# Patient Record
Sex: Female | Born: 1995 | Race: White | Hispanic: No
Health system: Southern US, Community
[De-identification: ages and names within clinical notes are randomized; demographics above are authoritative.]

---

## 2019-04-04 ENCOUNTER — Inpatient Hospital Stay (HOSPITAL_COMMUNITY)

## 2019-04-04 ENCOUNTER — Encounter (HOSPITAL_COMMUNITY): Payer: Self-pay | Admitting: *Deleted

## 2019-04-04 ENCOUNTER — Emergency Department (HOSPITAL_COMMUNITY)

## 2019-04-04 ENCOUNTER — Other Ambulatory Visit: Payer: Self-pay

## 2019-04-04 ENCOUNTER — Inpatient Hospital Stay (HOSPITAL_COMMUNITY)
Admission: EM | Admit: 2019-04-04 | Discharge: 2019-04-28 | DRG: 922 | Disposition: E | Attending: Internal Medicine | Admitting: Internal Medicine

## 2019-04-04 DIAGNOSIS — R778 Other specified abnormalities of plasma proteins: Secondary | ICD-10-CM | POA: Diagnosis not present

## 2019-04-04 DIAGNOSIS — J9602 Acute respiratory failure with hypercapnia: Secondary | ICD-10-CM | POA: Diagnosis present

## 2019-04-04 DIAGNOSIS — R402112 Coma scale, eyes open, never, at arrival to emergency department: Secondary | ICD-10-CM | POA: Diagnosis present

## 2019-04-04 DIAGNOSIS — O99321 Drug use complicating pregnancy, first trimester: Secondary | ICD-10-CM | POA: Diagnosis present

## 2019-04-04 DIAGNOSIS — X838XXA Intentional self-harm by other specified means, initial encounter: Secondary | ICD-10-CM

## 2019-04-04 DIAGNOSIS — Z20822 Contact with and (suspected) exposure to covid-19: Secondary | ICD-10-CM | POA: Diagnosis present

## 2019-04-04 DIAGNOSIS — R7401 Elevation of levels of liver transaminase levels: Secondary | ICD-10-CM | POA: Diagnosis not present

## 2019-04-04 DIAGNOSIS — K921 Melena: Secondary | ICD-10-CM | POA: Diagnosis present

## 2019-04-04 DIAGNOSIS — T71162A Asphyxiation due to hanging, intentional self-harm, initial encounter: Secondary | ICD-10-CM | POA: Diagnosis present

## 2019-04-04 DIAGNOSIS — Z66 Do not resuscitate: Secondary | ICD-10-CM | POA: Diagnosis present

## 2019-04-04 DIAGNOSIS — G936 Cerebral edema: Secondary | ICD-10-CM | POA: Diagnosis present

## 2019-04-04 DIAGNOSIS — I469 Cardiac arrest, cause unspecified: Secondary | ICD-10-CM | POA: Diagnosis present

## 2019-04-04 DIAGNOSIS — Z3A11 11 weeks gestation of pregnancy: Secondary | ICD-10-CM

## 2019-04-04 DIAGNOSIS — E872 Acidosis: Secondary | ICD-10-CM | POA: Diagnosis present

## 2019-04-04 DIAGNOSIS — K7201 Acute and subacute hepatic failure with coma: Secondary | ICD-10-CM | POA: Diagnosis present

## 2019-04-04 DIAGNOSIS — O99351 Diseases of the nervous system complicating pregnancy, first trimester: Secondary | ICD-10-CM | POA: Diagnosis present

## 2019-04-04 DIAGNOSIS — F1120 Opioid dependence, uncomplicated: Secondary | ICD-10-CM | POA: Diagnosis present

## 2019-04-04 DIAGNOSIS — O26611 Liver and biliary tract disorders in pregnancy, first trimester: Secondary | ICD-10-CM | POA: Diagnosis present

## 2019-04-04 DIAGNOSIS — N179 Acute kidney failure, unspecified: Secondary | ICD-10-CM | POA: Diagnosis present

## 2019-04-04 DIAGNOSIS — E876 Hypokalemia: Secondary | ICD-10-CM | POA: Diagnosis present

## 2019-04-04 DIAGNOSIS — O99411 Diseases of the circulatory system complicating pregnancy, first trimester: Secondary | ICD-10-CM | POA: Diagnosis present

## 2019-04-04 DIAGNOSIS — R402212 Coma scale, best verbal response, none, at arrival to emergency department: Secondary | ICD-10-CM | POA: Diagnosis present

## 2019-04-04 DIAGNOSIS — R402312 Coma scale, best motor response, none, at arrival to emergency department: Secondary | ICD-10-CM | POA: Diagnosis present

## 2019-04-04 DIAGNOSIS — Z0189 Encounter for other specified special examinations: Secondary | ICD-10-CM

## 2019-04-04 DIAGNOSIS — O99281 Endocrine, nutritional and metabolic diseases complicating pregnancy, first trimester: Secondary | ICD-10-CM | POA: Diagnosis present

## 2019-04-04 DIAGNOSIS — J9601 Acute respiratory failure with hypoxia: Secondary | ICD-10-CM | POA: Diagnosis present

## 2019-04-04 DIAGNOSIS — T71164A Asphyxiation due to hanging, undetermined, initial encounter: Secondary | ICD-10-CM | POA: Diagnosis present

## 2019-04-04 DIAGNOSIS — G931 Anoxic brain damage, not elsewhere classified: Secondary | ICD-10-CM | POA: Diagnosis present

## 2019-04-04 DIAGNOSIS — Z3490 Encounter for supervision of normal pregnancy, unspecified, unspecified trimester: Secondary | ICD-10-CM

## 2019-04-04 DIAGNOSIS — Y92143 Cell of prison as the place of occurrence of the external cause: Secondary | ICD-10-CM | POA: Diagnosis not present

## 2019-04-04 LAB — COMPREHENSIVE METABOLIC PANEL
ALT: 351 U/L — ABNORMAL HIGH (ref 0–44)
ALT: 622 U/L — ABNORMAL HIGH (ref 0–44)
AST: 315 U/L — ABNORMAL HIGH (ref 15–41)
AST: 562 U/L — ABNORMAL HIGH (ref 15–41)
Albumin: 4.2 g/dL (ref 3.5–5.0)
Albumin: 3.4 g/dL — ABNORMAL LOW (ref 3.5–5.0)
Alkaline Phosphatase: 107 U/L (ref 38–126)
Alkaline Phosphatase: 163 U/L — ABNORMAL HIGH (ref 38–126)
Anion gap: 25 — ABNORMAL HIGH (ref 5–15)
BUN: 16 mg/dL (ref 6–20)
BUN: 25 mg/dL — ABNORMAL HIGH (ref 6–20)
CO2: 12 mmol/L — ABNORMAL LOW (ref 22–32)
CO2: 9 mmol/L — ABNORMAL LOW (ref 22–32)
Calcium: 9 mg/dL (ref 8.9–10.3)
Calcium: 7.6 mg/dL — ABNORMAL LOW (ref 8.9–10.3)
Chloride: 97 mmol/L — ABNORMAL LOW (ref 98–111)
Chloride: 104 mmol/L (ref 98–111)
Creatinine, Ser: 1.57 mg/dL — ABNORMAL HIGH (ref 0.44–1.00)
Creatinine, Ser: 1.6 mg/dL — ABNORMAL HIGH (ref 0.44–1.00)
GFR calc Af Amer: 53 mL/min — ABNORMAL LOW (ref >60)
GFR calc Af Amer: 52 mL/min — ABNORMAL LOW (ref >60)
GFR calc non Af Amer: 46 mL/min — ABNORMAL LOW (ref >60)
GFR calc non Af Amer: 45 mL/min — ABNORMAL LOW (ref >60)
Glucose, Bld: 232 mg/dL — ABNORMAL HIGH (ref 70–99)
Glucose, Bld: 289 mg/dL — ABNORMAL HIGH (ref 70–99)
Potassium: 3.1 mmol/L — ABNORMAL LOW (ref 3.5–5.1)
Potassium: 3.5 mmol/L (ref 3.5–5.1)
Sodium: 137 mmol/L (ref 135–145)
Sodium: 138 mmol/L (ref 135–145)
Total Bilirubin: 0.9 mg/dL (ref 0.3–1.2)
Total Bilirubin: 1.5 mg/dL — ABNORMAL HIGH (ref 0.3–1.2)
Total Protein: 7.3 g/dL (ref 6.5–8.1)
Total Protein: 5.5 g/dL — ABNORMAL LOW (ref 6.5–8.1)

## 2019-04-04 LAB — CBC WITH DIFFERENTIAL/PLATELET
Abs Immature Granulocytes: 1.23 10*3/uL — ABNORMAL HIGH (ref 0.00–0.07)
Basophils Absolute: 0.1 10*3/uL (ref 0.0–0.1)
Basophils Relative: 1 %
Eosinophils Absolute: 0.1 10*3/uL (ref 0.0–0.5)
Eosinophils Relative: 1 %
HCT: 44.6 % (ref 36.0–46.0)
Hemoglobin: 13.9 g/dL (ref 12.0–15.0)
Immature Granulocytes: 7 %
Lymphocytes Relative: 27 %
Lymphs Abs: 4.8 10*3/uL — ABNORMAL HIGH (ref 0.7–4.0)
MCH: 30.3 pg (ref 26.0–34.0)
MCHC: 31.2 g/dL (ref 30.0–36.0)
MCV: 97.2 fL (ref 80.0–100.0)
Monocytes Absolute: 0.6 10*3/uL (ref 0.1–1.0)
Monocytes Relative: 4 %
Neutro Abs: 10.6 10*3/uL — ABNORMAL HIGH (ref 1.7–7.7)
Neutrophils Relative %: 60 %
Platelets: 312 10*3/uL (ref 150–400)
RBC: 4.59 MIL/uL (ref 3.87–5.11)
RDW: 13.2 % (ref 11.5–15.5)
WBC: 17.5 10*3/uL — ABNORMAL HIGH (ref 4.0–10.5)
nRBC: 0 % (ref 0.0–0.2)

## 2019-04-04 LAB — URINALYSIS, ROUTINE W REFLEX MICROSCOPIC
Bilirubin Urine: NEGATIVE
Glucose, UA: 150 mg/dL — AB
Ketones, ur: 20 mg/dL — AB
Leukocytes,Ua: NEGATIVE
Nitrite: NEGATIVE
Protein, ur: 100 mg/dL — AB
Specific Gravity, Urine: 1.023 (ref 1.005–1.030)
pH: 5 (ref 5.0–8.0)

## 2019-04-04 LAB — POCT I-STAT 7, (LYTES, BLD GAS, ICA,H+H)
Acid-base deficit: 6 mmol/L — ABNORMAL HIGH (ref 0.0–2.0)
Bicarbonate: 15.9 mmol/L — ABNORMAL LOW (ref 20.0–28.0)
Calcium, Ion: 0.91 mmol/L — ABNORMAL LOW (ref 1.15–1.40)
HCT: 44 % (ref 36.0–46.0)
Hemoglobin: 15 g/dL (ref 12.0–15.0)
O2 Saturation: 99 %
Patient temperature: 104
Potassium: 3.1 mmol/L — ABNORMAL LOW (ref 3.5–5.1)
Sodium: 141 mmol/L (ref 135–145)
TCO2: 17 mmol/L — ABNORMAL LOW (ref 22–32)
pCO2 arterial: 26.6 mmHg — ABNORMAL LOW (ref 32.0–48.0)
pH, Arterial: 7.397 (ref 7.350–7.450)
pO2, Arterial: 169 mmHg — ABNORMAL HIGH (ref 83.0–108.0)

## 2019-04-04 LAB — RESPIRATORY PANEL BY RT PCR (FLU A&B, COVID)
Influenza A by PCR: NEGATIVE
Influenza B by PCR: NEGATIVE
SARS Coronavirus 2 by RT PCR: NEGATIVE

## 2019-04-04 LAB — RAPID URINE DRUG SCREEN, HOSP PERFORMED
Amphetamines: NOT DETECTED
Barbiturates: NOT DETECTED
Benzodiazepines: NOT DETECTED
Cocaine: NOT DETECTED
Opiates: NOT DETECTED
Tetrahydrocannabinol: NOT DETECTED

## 2019-04-04 LAB — BLOOD GAS, ARTERIAL
Acid-base deficit: 17.8 mmol/L — ABNORMAL HIGH (ref 0.0–2.0)
Bicarbonate: 11.4 mmol/L — ABNORMAL LOW (ref 20.0–28.0)
FIO2: 100
O2 Saturation: 98.9 %
Patient temperature: 37.5
pCO2 arterial: 25.2 mmHg — ABNORMAL LOW (ref 32.0–48.0)
pH, Arterial: 7.178 — CL (ref 7.350–7.450)
pO2, Arterial: 493 mmHg — ABNORMAL HIGH (ref 83.0–108.0)

## 2019-04-04 LAB — GLUCOSE, CAPILLARY: Glucose-Capillary: 167 mg/dL — ABNORMAL HIGH (ref 70–99)

## 2019-04-04 LAB — LACTIC ACID, PLASMA
Lactic Acid, Venous: >11 mmol/L (ref 0.5–1.9)
Lactic Acid, Venous: 10.6 mmol/L (ref 0.5–1.9)
Lactic Acid, Venous: >11 mmol/L (ref 0.5–1.9)
Lactic Acid, Venous: >11 mmol/L (ref 0.5–1.9)

## 2019-04-04 LAB — BLOOD GAS, VENOUS
Acid-base deficit: 18.3 mmol/L — ABNORMAL HIGH (ref 0.0–2.0)
Bicarbonate: 10.4 mmol/L — ABNORMAL LOW (ref 20.0–28.0)
FIO2: 100
O2 Saturation: 98.7 %
Patient temperature: 34
pCO2, Ven: 35.3 mmHg — ABNORMAL LOW (ref 44.0–60.0)
pH, Ven: 7.072 — CL (ref 7.250–7.430)
pO2, Ven: 486 mmHg — ABNORMAL HIGH (ref 32.0–45.0)

## 2019-04-04 LAB — PROTIME-INR
INR: 1.5 — ABNORMAL HIGH (ref 0.8–1.2)
Prothrombin Time: 18 seconds — ABNORMAL HIGH (ref 11.4–15.2)

## 2019-04-04 LAB — HIV ANTIBODY (ROUTINE TESTING W REFLEX): HIV Screen 4th Generation wRfx: NONREACTIVE

## 2019-04-04 LAB — HEPATITIS PANEL, ACUTE
HCV Ab: REACTIVE — AB
Hep A IgM: NONREACTIVE
Hep B C IgM: NONREACTIVE
Hepatitis B Surface Ag: NONREACTIVE

## 2019-04-04 LAB — TROPONIN I (HIGH SENSITIVITY)
Troponin I (High Sensitivity): 67 ng/L — ABNORMAL HIGH (ref <18)
Troponin I (High Sensitivity): 2236 ng/L (ref <18)
Troponin I (High Sensitivity): 5429 ng/L (ref <18)
Troponin I (High Sensitivity): 6586 ng/L (ref <18)

## 2019-04-04 LAB — ETHANOL: Alcohol, Ethyl (B): <10 mg/dL (ref <10)

## 2019-04-04 LAB — PREGNANCY, URINE: Preg Test, Ur: POSITIVE — AB

## 2019-04-04 LAB — APTT: aPTT: 51 seconds — ABNORMAL HIGH (ref 24–36)

## 2019-04-04 LAB — CK: Total CK: 1474 U/L — ABNORMAL HIGH (ref 38–234)

## 2019-04-04 LAB — MRSA PCR SCREENING: MRSA by PCR: POSITIVE — AB

## 2019-04-04 MED ORDER — NOREPINEPHRINE 4 MG/250ML-% IV SOLN: 2.0000 ug/min | INTRAVENOUS | Status: DC

## 2019-04-04 MED ORDER — SODIUM CHLORIDE 0.9 % IV SOLN
0.0000 ug/h | INTRAVENOUS | Status: DC
  Administered 2019-04-04: 250 ug/h via INTRAVENOUS
  Administered 2019-04-04: 25 ug/h via INTRAVENOUS
  Filled 2019-04-04 (×2): qty 50

## 2019-04-04 MED ORDER — PANTOPRAZOLE SODIUM 40 MG IV SOLR
40.0000 mg | Freq: Two times a day (BID) | INTRAVENOUS | Status: DC
  Administered 2019-04-04 (×2): 40 mg via INTRAVENOUS
  Filled 2019-04-04 (×2): qty 40

## 2019-04-04 MED ORDER — FENTANYL 2500MCG IN NS 250ML (10MCG/ML) PREMIX INFUSION: 0.0000 ug/h | INTRAVENOUS | Status: DC

## 2019-04-04 MED ORDER — LEVETIRACETAM IN NACL 1000 MG/100ML IV SOLN
1000.0000 mg | Freq: Once | INTRAVENOUS | Status: AC
  Administered 2019-04-04: 16:00:00 1000 mg via INTRAVENOUS
  Filled 2019-04-04: qty 100

## 2019-04-04 MED ORDER — LACTATED RINGERS IV SOLN: INTRAVENOUS | Status: DC

## 2019-04-04 MED ORDER — LEVETIRACETAM IN NACL 500 MG/100ML IV SOLN
500.0000 mg | Freq: Two times a day (BID) | INTRAVENOUS | Status: DC
  Administered 2019-04-05: 500 mg via INTRAVENOUS
  Filled 2019-04-04: qty 100

## 2019-04-04 MED ORDER — CHLORHEXIDINE GLUCONATE CLOTH 2 % EX PADS
6.0000 | MEDICATED_PAD | Freq: Every day | CUTANEOUS | Status: DC
  Administered 2019-04-04: 16:00:00 6 via TOPICAL

## 2019-04-04 MED ORDER — ONDANSETRON HCL 4 MG/2ML IJ SOLN: 4.0000 mg | Freq: Four times a day (QID) | INTRAMUSCULAR | Status: DC | PRN

## 2019-04-04 MED ORDER — SODIUM CHLORIDE 0.9 % IV BOLUS
2000.0000 mL | Freq: Once | INTRAVENOUS | Status: AC
  Administered 2019-04-04: 11:00:00 2000 mL via INTRAVENOUS

## 2019-04-04 MED ORDER — SODIUM CHLORIDE 0.9 % IV SOLN: 250.0000 mL | INTRAVENOUS | Status: DC

## 2019-04-04 MED ORDER — SODIUM BICARBONATE 8.4 % IV SOLN
INTRAVENOUS | Status: AC
  Administered 2019-04-04: 16:00:00 100 meq
  Filled 2019-04-04: qty 100

## 2019-04-04 MED ORDER — ACETAMINOPHEN 325 MG PO TABS
650.0000 mg | ORAL_TABLET | ORAL | Status: DC | PRN
  Filled 2019-04-04: qty 2

## 2019-04-04 MED ORDER — ORAL CARE MOUTH RINSE
15.0000 mL | OROMUCOSAL | Status: DC
  Administered 2019-04-04 – 2019-04-05 (×3): 15 mL via OROMUCOSAL

## 2019-04-04 MED ORDER — PHENYLEPHRINE HCL-NACL 10-0.9 MG/250ML-% IV SOLN
0.0000 ug/min | INTRAVENOUS | Status: DC
  Administered 2019-04-04: 20 ug/min via INTRAVENOUS
  Administered 2019-04-05: 03:00:00 55 ug/min via INTRAVENOUS
  Filled 2019-04-04 (×2): qty 250

## 2019-04-04 MED ORDER — SODIUM BICARBONATE 8.4 % IV SOLN: 100.0000 meq | Freq: Once | INTRAVENOUS | Status: AC

## 2019-04-04 MED ORDER — LEVETIRACETAM IN NACL 500 MG/100ML IV SOLN: 500.0000 mg | Freq: Two times a day (BID) | INTRAVENOUS | Status: DC

## 2019-04-04 MED ORDER — PROPOFOL 1000 MG/100ML IV EMUL
5.0000 ug/kg/min | INTRAVENOUS | Status: DC
  Administered 2019-04-04 (×2): 5 ug/kg/min via INTRAVENOUS
  Filled 2019-04-04 (×3): qty 100

## 2019-04-04 MED ORDER — ACETAMINOPHEN 325 MG PO TABS
650.0000 mg | ORAL_TABLET | ORAL | Status: DC | PRN
  Administered 2019-04-04: 650 mg

## 2019-04-04 MED ORDER — NOREPINEPHRINE 4 MG/250ML-% IV SOLN
2.0000 ug/min | INTRAVENOUS | Status: DC
  Administered 2019-04-04: 2 ug/min via INTRAVENOUS
  Filled 2019-04-04: qty 250

## 2019-04-04 MED ORDER — SODIUM BICARBONATE-DEXTROSE 150-5 MEQ/L-% IV SOLN
150.0000 meq | INTRAVENOUS | Status: DC
  Administered 2019-04-04 – 2019-04-05 (×2): 150 meq via INTRAVENOUS
  Filled 2019-04-04 (×2): qty 1000

## 2019-04-04 MED ORDER — CHLORHEXIDINE GLUCONATE 0.12% ORAL RINSE (MEDLINE KIT)
15.0000 mL | Freq: Two times a day (BID) | OROMUCOSAL | Status: DC
  Administered 2019-04-04: 15 mL via OROMUCOSAL

## 2019-04-04 NOTE — ED Provider Notes (Signed)
[facility] EMERGENCY DEPARTMENT Provider Note   CSN: [id number] Arrival date & time: 04/17/2019  1005     History Chief Complaint  Patient presents with   Cardiac Arrest    Bianca Miller is a 24 y.o. female presenting from [facility] after cardiac arrest s/p ROSC.  History provided by the paramedics on arrival.  The [facility] apparently was [facility] around 9 AM this morning.  She was found approximately 20 minutes later hanging with a blanket around her neck from her bunk bed.  She was unresponsive.  She was cut down and CPR was immediately started.  The paramedics arrived on scene around 940.  The patient was in asystole at that time.  They continued CPR.  The patient was given 2 rounds of epinephrine and subsequently intubated with 7-0 ET tube on first attempt.  She had ROSC around 959 (19 minute code by EMS).  An IO was placed in her right tibia by EMS.  On arrival the patient is intubated, unresponsive.  HPI     History reviewed. No pertinent past medical history.  Patient Active Problem List   Diagnosis Date Noted   Cardiac arrest [facility]) 04/19/2019   Hanging, initial encounter 04/11/2019    History reviewed. No pertinent surgical history.   OB History   No obstetric history on file.     No family history on file.  Social History   Tobacco Use   Smoking status: Unknown If Ever Smoked  Substance Use Topics   Alcohol use: Not on file   Drug use: Not on file    Home Medications Prior to Admission medications   Not on File    Allergies    Patient has no known allergies.  Review of Systems   Review of Systems  Unable to perform ROS: Intubated (level 5 caveat)    Physical Exam Updated Vital Signs BP 112/71    Pulse (!) 129    Temp 98.2 F (36.8 C)    Resp (!) 23    Ht _0  (1.626 m)    Wt 63.5 kg    SpO2 100%    BMI 24.03 kg/m   Physical Exam Vitals and nursing note reviewed.  Constitutional:      Appearance: She  is well-developed.     Comments: Intubated, unresponsive  HENT:     Head: Normocephalic and atraumatic.  Eyes:     Comments: Pupils 3 mm bilaterally, unresponsive to light  Neck:     Comments: C spine collar in place Cardiovascular:     Rate and Rhythm: Normal rate.     Comments: Weak femoral and radial pulses Pulmonary:     Comments: Diminished left sided breath sounds in the lung base, present in apex Right breath sounds normal with bagging 100% O2 saturation Skin:    General: Skin is warm and dry.  Neurological:     GCS: GCS eye subscore is 1. GCS verbal subscore is 1. GCS motor subscore is 1.     Comments: No corneal or gag reflex Pupils fixed and dilated     ED Results / Procedures / Treatments   Labs (all labs ordered are listed, but only abnormal results are displayed) Labs Reviewed  MRSA PCR SCREENING - Abnormal; Notable for the following components:      Result Value   MRSA by PCR POSITIVE (*)    All other components within normal limits  LACTIC ACID, PLASMA - Abnormal; Notable for  the following components:   Lactic Acid, Venous >11.0 (*)    All other components within normal limits  LACTIC ACID, PLASMA - Abnormal; Notable for the following components:   Lactic Acid, Venous 10.6 (*)    All other components within normal limits  COMPREHENSIVE METABOLIC PANEL - Abnormal; Notable for the following components:   Potassium 3.1 (*)    Chloride 97 (*)    CO2 12 (*)    Glucose, Bld 232 (*)    Creatinine, Ser 1.57 (*)    AST 315 (*)    ALT 351 (*)    GFR calc non Af Amer 46 (*)    GFR calc Af Amer 53 (*)    All other components within normal limits  CBC WITH DIFFERENTIAL/PLATELET - Abnormal; Notable for the following components:   WBC 17.5 (*)    Neutro Abs 10.6 (*)    Lymphs Abs 4.8 (*)    Abs Immature Granulocytes 1.23 (*)    All other components within normal limits  PROTIME-INR - Abnormal; Notable for the following components:   Prothrombin Time 18.0 (*)      INR 1.5 (*)    All other components within normal limits  APTT - Abnormal; Notable for the following components:   aPTT 51 (*)    All other components within normal limits  BLOOD GAS, VENOUS - Abnormal; Notable for the following components:   pH, Ven 7.072 (*)    pCO2, Ven 35.3 (*)    pO2, Ven 486.0 (*)    Bicarbonate 10.4 (*)    Acid-base deficit 18.3 (*)    All other components within normal limits  BLOOD GAS, ARTERIAL - Abnormal; Notable for the following components:   pH, Arterial 7.178 (*)    pCO2 arterial 25.2 (*)    pO2, Arterial 493 (*)    Bicarbonate 11.4 (*)    Acid-base deficit 17.8 (*)    All other components within normal limits  PREGNANCY, URINE - Abnormal; Notable for the following components:   Preg Test, Ur POSITIVE (*)    All other components within normal limits  COMPREHENSIVE METABOLIC PANEL - Abnormal; Notable for the following components:   CO2 9 (*)    Glucose, Bld 289 (*)    BUN 25 (*)    Creatinine, Ser 1.60 (*)    Calcium 7.6 (*)    Total Protein 5.5 (*)    Albumin 3.4 (*)    AST 562 (*)    ALT 622 (*)    Alkaline Phosphatase 163 (*)    Total Bilirubin 1.5 (*)    GFR calc non Af Amer 45 (*)    GFR calc Af Amer 52 (*)    Anion gap 25 (*)    All other components within normal limits  CK - Abnormal; Notable for the following components:   Total CK 1,474 (*)    All other components within normal limits  URINALYSIS, ROUTINE W REFLEX MICROSCOPIC - Abnormal; Notable for the following components:   Color, Urine AMBER (*)    APPearance CLOUDY (*)    Glucose, UA 150 (*)    Hgb urine dipstick LARGE (*)    Ketones, ur 20 (*)    Protein, ur 100 (*)    Bacteria, UA MANY (*)    All other components within normal limits  HEPATITIS PANEL, ACUTE - Abnormal; Notable for the following components:   HCV Ab Reactive (*)    All other components within normal limits  LACTIC  ACID, PLASMA - Abnormal; Notable for the following components:   Lactic Acid, Venous  >11.0 (*)    All other components within normal limits  POCT I-STAT 7, (LYTES, BLD GAS, ICA,H+H) - Abnormal; Notable for the following components:   pCO2 arterial 26.6 (*)    pO2, Arterial 169.0 (*)    Bicarbonate 15.9 (*)    TCO2 17 (*)    Acid-base deficit 6.0 (*)    Potassium 3.1 (*)    Calcium, Ion 0.91 (*)    All other components within normal limits  TROPONIN I (HIGH SENSITIVITY) - Abnormal; Notable for the following components:   Troponin I (High Sensitivity) 67 (*)    All other components within normal limits  TROPONIN I (HIGH SENSITIVITY) - Abnormal; Notable for the following components:   Troponin I (High Sensitivity) 2,236 (*)    All other components within normal limits  TROPONIN I (HIGH SENSITIVITY) - Abnormal; Notable for the following components:   Troponin I (High Sensitivity) 5,429 (*)    All other components within normal limits  RESPIRATORY PANEL BY RT PCR (FLU A&B, COVID)  ETHANOL  RAPID URINE DRUG SCREEN, HOSP PERFORMED  HIV ANTIBODY (ROUTINE TESTING W REFLEX)  BLOOD GAS, ARTERIAL  LACTIC ACID, PLASMA  CBC  BLOOD GAS, ARTERIAL  MAGNESIUM  PHOSPHORUS  COMPREHENSIVE METABOLIC PANEL  TROPONIN I (HIGH SENSITIVITY)    EKG None  Radiology CT Head Wo Contrast  Result Date: 04/02/2019 CLINICAL DATA:  Attempted hanging. EXAM: CT HEAD WITHOUT CONTRAST CT CERVICAL SPINE WITHOUT CONTRAST TECHNIQUE: Multidetector CT imaging of the head and cervical spine was performed following the standard protocol without intravenous contrast. Multiplanar CT image reconstructions of the cervical spine were also generated. COMPARISON:  None. FINDINGS: CT HEAD FINDINGS Brain: Small ventricles. Subarachnoid space diffusely effaced. Question global cerebral edema. No hemorrhage, fluid collection or midline shift. Vascular: Negative for hyperdense vessel Skull: Negative Sinuses/Orbits: Negative Other: None CT CERVICAL SPINE FINDINGS Alignment: Normal Skull base and vertebrae: Negative for  fracture Soft tissues and spinal canal: Negative for soft tissue mass or swelling. Endotracheal tube present. Disc levels: Normal disc spaces. No degenerative change or spurring. Upper chest: Lung apices clear bilaterally. Other: None IMPRESSION: 1. Findings suspicious for global cerebral edema due to anoxia. No intracranial hemorrhage. 2. Negative cervical spine. 3. These results were called by telephone at the time of interpretation on 04/11/2019 at 11:00 am to provider [facility] , who verbally acknowledged these results. Electronically Signed   By: Franchot Gallo M.D.   On: 04/18/2019 11:00   CT Chest Wo Contrast  Result Date: 04/12/2019 CLINICAL DATA:  Attempted suicide by hanging, status post resuscitation, intubated EXAM: CT CHEST WITHOUT CONTRAST TECHNIQUE: Multidetector CT imaging of the chest was performed following the standard protocol without IV contrast. COMPARISON:  04/19/2019 FINDINGS: Cardiovascular: Unenhanced imaging of the heart and great vessels demonstrate no pericardial effusion. No significant atherosclerosis. Mediastinum/Nodes: No enlarged mediastinal or axillary lymph nodes. Thyroid gland, trachea, and esophagus demonstrate no significant findings. Lungs/Pleura: Patient is intubated, endotracheal tube 2 cm above carina. Dependent areas of consolidation are seen within the lungs, which could reflect atelectasis or aspiration. No effusion or pneumothorax. Central airways are patent. Upper Abdomen: Continued marked gaseous distension of the stomach. Decompression with enteric catheter may be useful. Musculoskeletal: There are incomplete left anterior second through fourth rib fractures, compatible with recent CPR. No other acute bony abnormalities. Reconstructed images demonstrate no additional findings. IMPRESSION: 1. Bilateral dependent lower lobe consolidation consistent with atelectasis  or aspiration. 2. Incomplete left anterior second through fourth rib fractures likely related to  resuscitation. 3. Intubated. Electronically Signed   By: Randa Ngo M.D.   On: 04/08/2019 10:58   CT Cervical Spine Wo Contrast  Result Date: 04/17/2019 CLINICAL DATA:  Attempted hanging. EXAM: CT HEAD WITHOUT CONTRAST CT CERVICAL SPINE WITHOUT CONTRAST TECHNIQUE: Multidetector CT imaging of the head and cervical spine was performed following the standard protocol without intravenous contrast. Multiplanar CT image reconstructions of the cervical spine were also generated. COMPARISON:  None. FINDINGS: CT HEAD FINDINGS Brain: Small ventricles. Subarachnoid space diffusely effaced. Question global cerebral edema. No hemorrhage, fluid collection or midline shift. Vascular: Negative for hyperdense vessel Skull: Negative Sinuses/Orbits: Negative Other: None CT CERVICAL SPINE FINDINGS Alignment: Normal Skull base and vertebrae: Negative for fracture Soft tissues and spinal canal: Negative for soft tissue mass or swelling. Endotracheal tube present. Disc levels: Normal disc spaces. No degenerative change or spurring. Upper chest: Lung apices clear bilaterally. Other: None IMPRESSION: 1. Findings suspicious for global cerebral edema due to anoxia. No intracranial hemorrhage. 2. Negative cervical spine. 3. These results were called by telephone at the time of interpretation on 04/23/2019 at 11:00 am to provider [facility] , who verbally acknowledged these results. Electronically Signed   By: Franchot Gallo M.D.   On: 04/12/2019 11:00   DG Chest 1V REPEAT Same Day  Result Date: 04/16/2019 CLINICAL DATA:  Intubated, suicide attempt by hanging EXAM: CHEST - 1 VIEW SAME DAY COMPARISON:  04/02/2019 10:22 a.m. FINDINGS: Single frontal view of the chest demonstrates repositioning of the endotracheal tube, tip overlying tracheal air column at level of thoracic inlet. Enteric catheter passes below diaphragm, tip excluded by collimation. Significant interval decompression of the stomach. Patchy areas of consolidation within  the lungs unchanged. No effusion or pneumothorax. IMPRESSION: 1. Support devices as above. 2. Persistent bilateral lung consolidation, consistent with atelectasis or aspiration based on CT. Electronically Signed   By: Randa Ngo M.D.   On: 04/18/2019 11:52   DG Chest Port 1 View  Result Date: 04/25/2019 CLINICAL DATA:  Acute hypoxic respiratory failure post hanging EXAM: PORTABLE CHEST 1 VIEW COMPARISON:  Radiograph 04/22/2019, CT 04/08/2019 FINDINGS: Endotracheal tube terminates in the mid trachea 3.6 cm from the carina. A transesophageal tube side port remains in the lower thoracic esophagus and should be advanced at least 5 cm to position within the gastric lumen for optimal functioning. Telemetry leads and additional support devices overlie the chest. Lateral persistent patchy bilateral opacities are again noted. No visible pneumothorax or effusion. Cardiomediastinal contours are unremarkable. No acute osseous or soft tissue abnormality. IMPRESSION: Bilateral patchy opacities are compatible with atelectasis or aspiration as seen on CT. Recommend advancement of the transesophageal tube at least 5 cm to position the side port within the gastric lumen for optimal function. Electronically Signed   By: Lovena Le M.D.   On: 04/21/2019 16:30   DG Chest Portable 1 View  Result Date: 04/15/2019 CLINICAL DATA:  24 year old female found hanging in jail cell, CPR. EXAM: PORTABLE CHEST 1 VIEW COMPARISON:  Pending chest CT today. FINDINGS: Portable AP supine view at 1022 hours. Moderate to severe gaseous distension of the stomach. Endotracheal tube tip near the carina and right mainstem, although about 8 mm above the carina on the contemporary CT. Normal cardiac size and mediastinal contours. Patchy peribronchial opacity, more apparent by CT. No evidence of pneumothorax, pleural effusion or pulmonary edema. No osseous abnormality identified. IMPRESSION: 1. ETT about 8  mm above the carina currently. 2. Moderate to  severe gas distended stomach, consider NG tube decompression. 3. Patchy bilateral pulmonary opacity better demonstrated on the contemporary CT. Favor aspiration in this setting. Electronically Signed   By: Genevie Ann M.D.   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 EEG to evaluate for seizure, ams. Level of alertness: comatose AEDs during EEG study: propofol, keppra Technical aspects: This EEG study was done with scalp electrodes positioned according to the 10-20 International system of electrode placement. Electrical activity was acquired at a sampling rate of _0  and reviewed with a high frequency filter of _1  and a low frequency filter of _2 . EEG data were recorded continuously and digitally stored. DESCRIPTION: EEG showed generalized background suppression. EEG was not reactive to noxious stimulation. Frequent head bobbing artifact was noted. Hyperventilation and photic stimulation were not performed due to ams. ABNORMALITY - Background suppression, generalized IMPRESSION: This study is suggestive of profound diffuse encephalopathy, non specific to etiology.  No seizures or epileptiform discharges were seen throughout the recording. [place]    Procedures .Critical Care Performed by: Wyvonnia Dusky, MD Authorized by: Wyvonnia Dusky, MD   Critical care provider statement:    Critical care time (minutes):  50   Critical care was necessary to treat or prevent imminent or life-threatening deterioration of the following conditions:  Circulatory failure and respiratory failure   Critical care was time spent personally by me on the following activities:   Discussions with consultants, evaluation of patient's response to treatment, examination of patient, ordering and performing treatments and interventions, ordering and review of laboratory studies, ordering and review of radiographic studies, pulse oximetry, re-evaluation of patient's condition, obtaining history from patient or surrogate and review of old charts Comments:     Cardiac arrest s/p ROSC requiring ventilator management, IV fluids, discussion with consultants   (including critical care time)  Medications Ordered in ED Medications  0.9 %  sodium chloride infusion (0 mLs Intravenous Stopped 04/06/2019 1046)  propofol (DIPRIVAN) 1000 MG/100ML infusion (0 mcg/kg/min  63.5 kg Intravenous Stopped 04/21/2019 1510)  fentaNYL (SUBLIMAZE) 2,500 mcg in sodium chloride 0.9 % 250 mL (10 mcg/mL) infusion (0 mcg/hr Intravenous Stopped 04/23/2019 1510)  Chlorhexidine Gluconate Cloth 2 % PADS 6 each (6 each Topical Given 04/06/2019 1539)  sodium bicarbonate 150 mEq in dextrose 5% 1000 mL infusion ( Intravenous Rate/Dose Verify 04/06/2019 1800)  pantoprazole (PROTONIX) injection 40 mg (40 mg Intravenous Given 04/20/2019 1726)  lactated ringers infusion ( Intravenous Rate/Dose Verify 04/20/2019 1800)  acetaminophen (TYLENOL) tablet 650 mg (has no administration in time range)  ondansetron (ZOFRAN) injection 4 mg (has no administration in time range)  levETIRAcetam (KEPPRA) IVPB 500 mg/100 mL premix (has no administration in time range)  chlorhexidine gluconate (MEDLINE KIT) (PERIDEX) 0.12 % solution 15 mL (has no administration in time range)  MEDLINE mouth rinse (has no administration in time range)  sodium chloride 0.9 % bolus 2,000 mL (0 mLs Intravenous Stopped 04/02/2019 1205)  sodium bicarbonate injection 100 mEq (0 mEq Intravenous Duplicate 09/02/79 1572)  sodium bicarbonate 1 mEq/mL injection (100 mEq  Given 04/12/2019 1538)  levETIRAcetam (KEPPRA) IVPB 1000 mg/100 mL premix ( Intravenous  Stopped 04/01/2019 1633)    ED Course  I  have reviewed the triage vital signs and the nursing notes.  Pertinent labs & imaging results that were available during my care of the patient were reviewed by me and considered in my medical decision making (see chart for details).  24 yo female, very sad case, found asphyxiated in cardiac arrest after hanging herself in her jail cell this morning.  It appears she may have asphyxiated anywhere from 0-20 minutes before being cut down and initiating CPR.  EMS arrived 20 minutes later and reported her in asystole.  They achieved ROSC after running their code for about 19 minutes, s/p epinephrine and intubation with 7-0 ETT in the field.  Patient here with fixed pupils, no corneal reflex, no gag reflex on initial exam.  ET placement confirmed.  She had CT imaging of brain and C-spine and chest, showing evidence of aspiration in her lungs.  No C-spine fracture (Collar was cleared).  Questionable brain effacement, likely consistent with anoxic brain injury, but not definitive at this time.  An extensive effort was initiated by police to find family members.  [facility] jail on this.  The patient has no listed emergency contacts here.   Clinical Course as of Apr 03 1908  Fri Apr 04, 2019  1025 Patient on her way to CT scan.  Intubated prior to arrival.  BP 68/43, with pulses, started peripheral levophed.  No pupil response or gag response.   [MT]  1102 We are working with Event organiser to reach family members.  Patient back from CT.  I will reach out to Critical care for admission.  Patient does appear to have a gag reflex now, ordered IV sedation medications   [MT]  1103 ET tube in appropriate position on CT scan 2 cm above carina   [MT]  1104 IMPRESSION: 1. Findings suspicious for global cerebral edema due to anoxia. No intracranial hemorrhage. 2. Negative cervical spine.   [MT]  1119 Admitted to Dr Shearon Stalls of the ICU.  Will need transfer to [facility] ICU, setting this up.   Still trying to reach family   [MT]  1254 [place] improving gradually, will continue monitoring.  Awaiting transfer to [facility].  Police stilla ttempting to track down family, home apparently is in [place]   [MT]    Clinical Course User Index [MT] Malacki Mcphearson, Carola Rhine, MD    Final Clinical Impression(s) / ED Diagnoses Final diagnoses:  Cardiac arrest [facility])    Rx / DC Orders ED Discharge Orders    None       Wyvonnia Dusky, MD 04/13/2019 1911

## 2019-04-04 NOTE — Progress Notes (Signed)
Tachypneic 40s on PRVC, tachycardic 130s, febrile to 103.5, hypotensive (MAP 55). Attempting to start minimal sedation for RR/HR, Elink notified for BP support/updated on abnormal VS.

## 2019-04-04 NOTE — Progress Notes (Signed)
CRITICAL VALUE ALERT  Critical Value:  Lactic Acid >11, Troponin 5429  Date & Time Notied:  04/11/19 1530  Provider Notified: Dr Celine Mans  Orders Received/Actions taken: Incr LR to 150 mL/hour Will continue to monitor  Jaclyn Shaggy RN

## 2019-04-04 NOTE — Progress Notes (Signed)
The chaplain visited as a result of nurse referral.  The chaplain attended the family conference with the physician and offered spiritual support for the family.  The chaplain passed this case to the on-call chaplain for further support.  Lavone Neri Chaplain Resident For questions concerning this note please contact me by pager [phone]

## 2019-04-04 NOTE — Progress Notes (Signed)
eLink Physician-Brief Progress Note Patient Name: Bianca Miller DOB: [birth year] MRN: [id number]   Date of Service  04/22/2019  HPI/Events of Note  Notified of hypotension, tachycardic and febrile. Anoxic brain injury on head CT. Continues to be on fluids at 150 cc/hr. Fetal age 24 weeks on [place]  eICU Interventions   Ordered to start phenylephrine.  Non-viable fetus unfortunately  Patient is to brought down for MRI as per bedside RN     Intervention Category Major Interventions: Hypotension - evaluation and management  Darl Pikes 04/24/2019, 11:21 PM

## 2019-04-04 NOTE — H&P (Signed)
PULMONARY / CRITICAL CARE MEDICINE   NAME:  Bianca Miller, MRN:  [id number], DOB:  Mar 04, 1995, LOS: 0 ADMISSION DATE:  April 22, 2019, CONSULTATION DATE: 22-Apr-2019 REFERRING MD: [facility] emergency department, CHIEF COMPLAINT: Anoxic brain injury following hanging incident.  BRIEF HISTORY:    24 year old found hanging from her apartment in [facility]. HISTORY OF PRESENT ILLNESS   70 year old resident of [facility] found hanging from her bunk with a blanket around her neck.  Found to be asystolic required 20 minutes of CPR prior to EMS arrival further 20 minutes of CPR plus epi x2.  Return of spontaneous circulation.  Intubated and transferred to [facility] for further evaluation and treatment.  CT scan indicative of early anoxic brain injury.  Being transferred to [facility] for further evaluation and treatment.  SIGNIFICANT PAST MEDICAL HISTORY   Family  SIGNIFICANT EVENTS:  04/03/1998 21 pounds hanging from her bra a systolic. STUDIES:   2019/04/22 CT of the head showing early anoxic brain injury 04-22-2019 CT C spine: negative for fracture.  CULTURES:  COVID/FLU PCR 2/5 neg  ANTIBIOTICS:    LINES/TUBES:  April 22, 2019 endotracheal tube>>  CONSULTANTS:   SUBJECTIVE:    CONSTITUTIONAL: BP (!) 154/110   Pulse (!) 133   Temp (!) 92.7 F (33.7 C) (Core)   Resp 18   Ht 5\' 4"  (1.626 m)   Wt 63.5 kg   SpO2 100%   BMI 24.03 kg/m   No intake/output data recorded.     Vent Mode: PRVC FiO2 (%):  [100 %] 100 % Set Rate:  [20 bmp] 20 bmp Vt Set:  [440 mL] 440 mL PEEP:  [5 cmH20] 5 cmH20 Plateau Pressure:  [19 cmH20] 19 cmH20  PHYSICAL EXAM: General:  Young adult female on vent Neuro: Unresponsive, sedated. No cough/gag. No corneal. Pupils 3 mm and fixed. Breathing over the vent.  HEENT:  [place], erythema circumferentially around neck.  Cardiovascular:  RRR, no MRG Lungs:  Coarse bilateral breath sounds Abdomen: Soft, non-distended.  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  - Neuro checks  Acute hypercarbic respiratory failure: due to decreased LOC - Full vent support - ABG - VAP bundle - Minimize sedation to facilitate neuro exams  Metabolic acidosis: lactic  - follow lactic, BMP - sodium bicarb temporarily  Hematochezia: mucous stool. Concern for bowel ischemia - Monitor - Follow CBC  AKI Hypokalemia - give K - Follow BMP  Elevated transaminases: likely shock liver, but with visible needle marks in antecubital cannot rule out hepatitis - hepatitis panel - trend LFT   Troponin elevation: Doubt ACS. 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 UDS negative. - supportive care   Best Practice / Goals of Care / Disposition.   DVT PROPHYLAXIS:PAS SUP:PPI NUTRITION:NPO MOBILITY:BR GOALS OF CARE: Ongoing FAMILY DISCUSSIONS: Under investigation DISPOSITION intensive care unit  LABS  Glucose No results for input(s): GLUCAP in the last 168 hours.  BMET Recent Labs  Lab Apr 22, 2019 1019  NA 137  K 3.1*  CL 97*  CO2 12*  BUN 16  CREATININE 1.57*  GLUCOSE 232*    Liver Enzymes Recent Labs  Lab April 22, 2019 1019  AST 315*  ALT 351*  ALKPHOS 107  BILITOT 0.9  ALBUMIN 4.2    Electrolytes Recent Labs  Lab 2019/04/16 1019  CALCIUM 9.0    CBC Recent Labs  Lab 04-16-2019 1019  WBC 17.5*  HGB 13.9  HCT 44.6  PLT 312    ABG No results for input(s): PHART, PCO2ART, PO2ART in the last 168 hours.  Coag's Recent Labs  Lab 04-16-2019 1019  APTT 51*  INR 1.5*    Sepsis Markers Recent Labs  Lab 04/16/2019 1018  LATICACIDVEN >11.0*     Cardiac Enzymes No results for input(s): TROPONINI, PROBNP in the last 168 hours.  PAST MEDICAL HISTORY :   She  has no past medical history on file.  PAST SURGICAL HISTORY:  She  has no past surgical history on file.  Not on File  No current facility-administered medications on file prior to encounter.   No current outpatient medications on file prior to encounter.    FAMILY HISTORY:   Her family history is not on file.  SOCIAL HISTORY:  She    REVIEW OF SYSTEMS:    NA   Georgann Housekeeper, AGACNP-BC [place]  See Amion for personal pager PCCM on call pager [phone]  Apr 16, 2019 3:48 PM

## 2019-04-04 NOTE — ED Notes (Signed)
Date and time results received: 04/21/2019 1246  Test: arterial PH  Critical Value: 7.178  Name of Provider Notified:TRifan  Orders Received? Or Actions Taken?: none at this time

## 2019-04-04 NOTE — ED Notes (Signed)
Pt sinus tach on monitor with multifocal pvc's

## 2019-04-04 NOTE — ED Triage Notes (Addendum)
[  pt is a resident of jail of [facility], was checked on at 09:00 by jail personal, pt was found at 09:20 hanging from a top bunk with a blanket, pt was taken down, cpr started, ems arrived at 09:40, cpr continued, pt was given 2 doses of epi 1:10 mg with ROSC, pt arrived to er intubated with bilateral breath sounds, et tube is 7.0 with 25 at the teeth, hr 123, pulse ox 100%. c-collar in place by ems, per edp pt's pupil fixad and dilated,

## 2019-04-04 NOTE — Consult Note (Addendum)
NEURO HOSPITALIST CONSULT NOTE   Requesting physician: Dr. Shearon Stalls  Reason for Consult: Prognostication status post cardiac arrest   History obtained from:  Chart review  HPI:                                                                                                                                          Bianca Miller is a 24 y.o. female with no known PMHx who was a prisoner at [employer]. Brought in s/p cardiac arrest after she was found hanging from a top bunk.  Per chart she was checked on at about 0900 and subsequently was found hanging from a top bunk at 0920. She was taken down and CPR was started. EMS arrived at approximately 0940 and CPR was continued. She received 2 doses of Epi with ROSC. Total downtime per report was approximately 50 minutes. Patient arrived intubated. Pupils were fixed and dilated. Patient was taken to [facility] initially, then transferred to [facility] for Neurology evaluation.  Hospital course: CTH: Suspicious for global cerebral edema d/t anoxia UDs: Negative  Arterial pH 7.178  History reviewed. No pertinent past medical history.  History reviewed. No pertinent surgical history.  No family history on file.          Social History:  Apparent heroin abuse  Not on File  MEDICATIONS:                                                                                                                     Scheduled: . Chlorhexidine Gluconate Cloth  6 each Topical Daily  . pantoprazole (PROTONIX) IV  40 mg Intravenous Q12H   Continuous: . sodium chloride Stopped (May 03, 2019 1046)  . fentaNYL 10 mcg/ml infusion 175 mcg/hr (2019-05-03 1340)  . lactated ringers 10 mL/hr at 05/03/19 1558  . [START ON 04/06/2019] levETIRAcetam    . propofol (DIPRIVAN) infusion 80 mcg/kg/min (2019/05/03 1300)  . sodium bicarbonate 150 mEq in dextrose 5% 1000 mL 150 mEq (2019/05/03 1557)   UKG:[id number], ondansetron (ZOFRAN) IV   ROS:  unobtainable from patient due to mental status and intubation   Blood pressure (!) 140/95, pulse (!) 129, temperature (!) 104 F (40 C), resp. rate 19, height 5\' 4"  (1.626 m), weight 63.5 kg, SpO2 100 %.  General Examination:                                                                                                       Physical Exam  Constitutional: Appears well-developed and well-nourished.  Eyes: Normal external eye and conjunctiva. HENT: Normocephalic, no lesions, without obvious abnormality.   Respiratory: Intubated. Overbreathing the vent.  Skin: Upper extremities with bilateral track marks.   Mental Status: no sedation Patient does not respond to verbal stimuli.  Does not respond to deep sternal rub.  Does not follow commands.  No verbalizations noted. Breathing above the vent. Eyes closed with no opening to any stimuli. No limb movement to any stimulus.  Cranial Nerves: II: Patient does not respond to confrontation bilaterally. PERRL. III,IV,VI:  No doll's eye reflex. Eyes conjugately near the midline. No nystagmus.  V,VII: Corneal reflexes absent bilaterally. On upper eyelids and under both eyes some intermittent twitching is visible, more prominent on the right. VIII: patient does not respond to verbal stimuli IX,X: gag reflex absent XI: trapezius strength unable to test bilaterally XII: tongue strength unable to test Motor: Extremities flaccid throughout.  No spontaneous movements noted.  No purposeful movements noted. No posturing to noxious.  Sensory: Does not respond to noxious stimuli in any extremity. Deep Tendon Reflexes:  Absent throughout. Plantars: absent bilaterally Cerebellar: Unable to perform    Lab Results: Basic Metabolic Panel: Recent Labs  Lab 04/27/2019 1019 04/02/2019 1553  NA 137 141  K 3.1* 3.1*  CL 97*  --   CO2  12*  --   GLUCOSE 232*  --   BUN 16  --   CREATININE 1.57*  --   CALCIUM 9.0  --     CBC: Recent Labs  Lab 04/11/2019 1019 04/02/2019 1553  WBC 17.5*  --   NEUTROABS 10.6*  --   HGB 13.9 15.0  HCT 44.6 44.0  MCV 97.2  --   PLT 312  --     Cardiac Enzymes: No results for input(s): CKTOTAL, CKMB, CKMBINDEX, TROPONINI in the last 168 hours.  Lipid Panel: No results for input(s): CHOL, TRIG, HDL, CHOLHDL, VLDL, LDLCALC in the last 168 hours.  Imaging: CT Head Wo Contrast  Result Date: 04/20/2019 CLINICAL DATA:  Attempted hanging. EXAM: CT HEAD WITHOUT CONTRAST CT CERVICAL SPINE WITHOUT CONTRAST TECHNIQUE: Multidetector CT imaging of the head and cervical spine was performed following the standard protocol without intravenous contrast. Multiplanar CT image reconstructions of the cervical spine were also generated. COMPARISON:  None. FINDINGS: CT HEAD FINDINGS Brain: Small ventricles. Subarachnoid space diffusely effaced. Question global cerebral edema. No hemorrhage, fluid collection or midline shift. Vascular: Negative for hyperdense vessel Skull: Negative Sinuses/Orbits: Negative Other: None CT CERVICAL SPINE FINDINGS Alignment: Normal Skull base and vertebrae: Negative for fracture Soft tissues and spinal canal: Negative for soft tissue mass or swelling.  Endotracheal tube present. Disc levels: Normal disc spaces. No degenerative change or spurring. Upper chest: Lung apices clear bilaterally. Other: None IMPRESSION: 1. Findings suspicious for global cerebral edema due to anoxia. No intracranial hemorrhage. 2. Negative cervical spine. 3. These results were called by telephone at the time of interpretation on Apr 08, 2019 at 11:00 am to provider [facility] , who verbally acknowledged these results. Electronically Signed   By: Marlan Palau M.D.   On: 2019-04-08 11:00   CT Chest Wo Contrast  Result Date: 2019-04-08 CLINICAL DATA:  Attempted suicide by hanging, status post resuscitation,  intubated EXAM: CT CHEST WITHOUT CONTRAST TECHNIQUE: Multidetector CT imaging of the chest was performed following the standard protocol without IV contrast. COMPARISON:  2019/04/08 FINDINGS: Cardiovascular: Unenhanced imaging of the heart and great vessels demonstrate no pericardial effusion. No significant atherosclerosis. Mediastinum/Nodes: No enlarged mediastinal or axillary lymph nodes. Thyroid gland, trachea, and esophagus demonstrate no significant findings. Lungs/Pleura: Patient is intubated, endotracheal tube 2 cm above carina. Dependent areas of consolidation are seen within the lungs, which could reflect atelectasis or aspiration. No effusion or pneumothorax. Central airways are patent. Upper Abdomen: Continued marked gaseous distension of the stomach. Decompression with enteric catheter may be useful. Musculoskeletal: There are incomplete left anterior second through fourth rib fractures, compatible with recent CPR. No other acute bony abnormalities. Reconstructed images demonstrate no additional findings. IMPRESSION: 1. Bilateral dependent lower lobe consolidation consistent with atelectasis or aspiration. 2. Incomplete left anterior second through fourth rib fractures likely related to resuscitation. 3. Intubated. Electronically Signed   By: Sharlet Salina M.D.   On: 2019/04/08 10:58   CT Cervical Spine Wo Contrast  Result Date: 04-08-19 CLINICAL DATA:  Attempted hanging. EXAM: CT HEAD WITHOUT CONTRAST CT CERVICAL SPINE WITHOUT CONTRAST TECHNIQUE: Multidetector CT imaging of the head and cervical spine was performed following the standard protocol without intravenous contrast. Multiplanar CT image reconstructions of the cervical spine were also generated. COMPARISON:  None. FINDINGS: CT HEAD FINDINGS Brain: Small ventricles. Subarachnoid space diffusely effaced. Question global cerebral edema. No hemorrhage, fluid collection or midline shift. Vascular: Negative for hyperdense vessel Skull:  Negative Sinuses/Orbits: Negative Other: None CT CERVICAL SPINE FINDINGS Alignment: Normal Skull base and vertebrae: Negative for fracture Soft tissues and spinal canal: Negative for soft tissue mass or swelling. Endotracheal tube present. Disc levels: Normal disc spaces. No degenerative change or spurring. Upper chest: Lung apices clear bilaterally. Other: None IMPRESSION: 1. Findings suspicious for global cerebral edema due to anoxia. No intracranial hemorrhage. 2. Negative cervical spine. 3. These results were called by telephone at the time of interpretation on 2019-04-08 at 11:00 am to provider [facility] , who verbally acknowledged these results. Electronically Signed   By: Marlan Palau M.D.   On: Apr 08, 2019 11:00   DG Chest 1V REPEAT Same Day  Result Date: 2019-04-08 CLINICAL DATA:  Intubated, suicide attempt by hanging EXAM: CHEST - 1 VIEW SAME DAY COMPARISON:  2019-04-08 10:22 a.m. FINDINGS: Single frontal view of the chest demonstrates repositioning of the endotracheal tube, tip overlying tracheal air column at level of thoracic inlet. Enteric catheter passes below diaphragm, tip excluded by collimation. Significant interval decompression of the stomach. Patchy areas of consolidation within the lungs unchanged. No effusion or pneumothorax. IMPRESSION: 1. Support devices as above. 2. Persistent bilateral lung consolidation, consistent with atelectasis or aspiration based on CT. Electronically Signed   By: Sharlet Salina M.D.   On: Apr 08, 2019 11:52   DG Chest Portable 1 View  Result Date: 08-Apr-2019  CLINICAL DATA:  24 year old female found hanging in jail cell, CPR. EXAM: PORTABLE CHEST 1 VIEW COMPARISON:  Pending chest CT today. FINDINGS: Portable AP supine view at 1022 hours. Moderate to severe gaseous distension of the stomach. Endotracheal tube tip near the carina and right mainstem, although about 8 mm above the carina on the contemporary CT. Normal cardiac size and mediastinal contours.  Patchy peribronchial opacity, more apparent by CT. No evidence of pneumothorax, pleural effusion or pulmonary edema. No osseous abnormality identified. IMPRESSION: 1. ETT about 8 mm above the carina currently. 2. Moderate to severe gas distended stomach, consider NG tube decompression. 3. Patchy bilateral pulmonary opacity better demonstrated on the contemporary CT. Favor aspiration in this setting. Electronically Signed   By: Odessa Fleming M.D.   On: 04/19/2019 10:50   Valentina Lucks, MSN, NP-C [facility] [phone]  EEG: Findings: EEG showed generalized background suppression. EEG was not reactive to noxious stimulation. Frequent head bobbing artifact was noted.  Impression: This study is suggestive of profound diffuse encephalopathy, non specific to etiology.  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 Brought in s/p cardiac arrest after she was found hanging from a top bunk. 1. CTH: Findings suspicious for global cerebral edema due to anoxia. No intracranial hemorrhage.  2. CT cervical spine: Negative  3. On exam: Patient does not respond to any stimuli. She is breathing above the vent and pupils are reactive; other cranial nerve responses are absent. No response to noxious stimuli in any extremity.  Absent reflexes. 3. EEG findings are suggestive of profound diffuse encephalopathy, non specific to etiology. No seizures or epileptiform discharges are seen.   Recommendations: -- Supportive care -- MRI brain.  -- Repeat Neurological exam 48 hours after cardiac arrest, off all sedation for at least 24 hours.   I have seen and examined the patient. I have formulated the assessment and recommendations. 24 year old female with anoxic brain injury after being found hanging from bunk in jail cell. EEG suggestive of profound diffuse encephalopathy. Too early to prognosticate. MRI brain  recommended.  Electronically signed: Dr. Caryl Pina 04/11/2019, 4:22 PM

## 2019-04-04 NOTE — ED Notes (Signed)
Date and time results received: 04/22/2019 1149 (use smartphrase ".now" to insert current time)  Test: Ph Critical Value: 7.072  Name of Provider Notified: Dr Renaye Rakers  Orders Received? Or Actions Taken?:NA

## 2019-04-04 NOTE — ED Notes (Signed)
Pt in custody. Per officer Kennon, if there are any changes to be made call Capt Bullins [phone]

## 2019-04-04 NOTE — Progress Notes (Signed)
EEG completed, results pending. 

## 2019-04-04 NOTE — Procedures (Signed)
Patient Name: Bianca Miller  MRN: [id number]  Epilepsy Attending: Charlsie Quest  Referring Physician/Provider: Canary Brim, NP Date: 04/17/2019 Duration: 24.23 mins  Patient history: 24 yo F with anoxic brain injury secondary to self-asphyxiation and subsequent cardiac arrest. EEG to evaluate for seizure, ams.   Level of alertness: comatose  AEDs during EEG study: propofol, keppra  Technical aspects: This EEG study was done with scalp electrodes positioned according to the 10-20 International system of electrode placement. Electrical activity was acquired at a sampling rate of 500Hz  and reviewed with a high frequency filter of 70Hz  and a low frequency filter of 1Hz . EEG data were recorded continuously and digitally stored.   DESCRIPTION: EEG showed generalized background suppression. EEG was not reactive to noxious stimulation. Frequent head bobbing artifact was noted. Hyperventilation and photic stimulation were not performed due to ams.  ABNORMALITY - Background suppression, generalized   IMPRESSION: This study is suggestive of profound diffuse encephalopathy, non specific to etiology.  No seizures or epileptiform discharges were seen throughout the recording.  Jaree Trinka 

## 2019-04-05 ENCOUNTER — Inpatient Hospital Stay (HOSPITAL_COMMUNITY)

## 2019-04-28 NOTE — Progress Notes (Addendum)
At bedside when HR dropped from 130s to 60s, SBP 40s. No pulse palpated with doppler. Pt then developed vtach that progressed to vfib. No heart sounds auscultated. DNR order in place. CCM at bedside to pronounce. Time of death 96.   Attempted to reach mom and stepdad, with no answer.

## 2019-04-28 NOTE — Death Summary Note (Addendum)
Patient developed PEA arrest, not resuscitated with DNR order in place.  On my arrival patient without EKG activity, no pulse or audible heart beat.  Declared dead at 3:26 am 2018/04/18.  Attempted to contact patient's mother but no response at listed phone number.

## 2019-04-28 NOTE — Discharge Summary (Signed)
DEATH SUMMARY   Patient Details  Name: Bianca Miller MRN: [id number] DOB: 01-Apr-1995  Admission/Discharge Information   Admit Date:  04-29-2019  Date of Death: Date of Death: 04-30-19  Time of Death: Time of Death: 0326  Length of Stay: 1  Referring Physician: Patient, No Pcp Per   Reason(s) for Hospitalization  Anoxic Brain Injury  Diagnoses  Preliminary cause of death:   Asphyxiation Suicide Attempt Anoxic Brain Injury Opioid Dependence Disorder Secondary Diagnoses (including complications and co-morbidities):  Active Problems:   Cardiac arrest ([place])   Hanging, initial encounter   Brief Hospital Course (including significant findings, care, treatment, and services provided and events leading to death)  Bianca Miller is a 24 y.o. year old female who was a resident of [facility]. She was found hanging from her bunk with a blanket around her neck.  Found to be asystolic required 20 minutes of CPR prior to EMS arrival further 20 minutes of CPR plus epi x2.  She did have Return of spontaneous circulation.  She was intubated and transferred to [facility] for further evaluation and treatment.  CT scan indicative of early anoxic brain injury.  [facility] was[facility] evaluation and treatment.  On arrival to [facility], she was found to be severely acidotic, encephalopathic, with evidence of endorgan damage to multiple organ systems.  She was also found to have a positive urine pregnancy test and was [redacted] weeks pregnant.  Her family did arrive at the bedside, and her mother breast that Reyes had been struggling with opioid dependency for many years been struggling for the past few months.  She did make her a DNR.  Unfortunately before further evaluation with EEG and MRI could be obtained, Bianca Miller's organs began to fail and she underwent a PEA arrest, with time of death of 3:26 AM on 2018-04-29.  Pertinent Labs and Studies   Significant Diagnostic Studies CT Head Wo Contrast  Result Date: 04/29/2019 CLINICAL DATA:  Attempted hanging. EXAM: CT HEAD WITHOUT CONTRAST CT CERVICAL SPINE WITHOUT CONTRAST TECHNIQUE: Multidetector CT imaging of the head and cervical spine was performed following the standard protocol without intravenous contrast. Multiplanar CT image reconstructions of the cervical spine were also generated. COMPARISON:  None. FINDINGS: CT HEAD FINDINGS Brain: Small ventricles. Subarachnoid space diffusely effaced. Question global cerebral edema. No hemorrhage, fluid collection or midline shift. Vascular: Negative for hyperdense vessel Skull: Negative Sinuses/Orbits: Negative Other: None CT CERVICAL SPINE FINDINGS Alignment: Normal Skull base and vertebrae: Negative for fracture Soft tissues and spinal canal: Negative for soft tissue mass or swelling. Endotracheal tube present. Disc levels: Normal disc spaces. No degenerative change or spurring. Upper chest: Lung apices clear bilaterally. Other: None IMPRESSION: 1. Findings suspicious for global cerebral edema due to anoxia. No intracranial hemorrhage. 2. Negative cervical spine. 3. These results were called by telephone at the time of interpretation on 29-Apr-2019 at 11:00 am to provider [facility] , who verbally acknowledged these results. Electronically Signed   By: Franchot Gallo M.D.   On: 04/29/19 11:00   CT Chest Wo Contrast  Result Date: April 29, 2019 CLINICAL DATA:  Attempted suicide by hanging, status post resuscitation, intubated EXAM: CT CHEST WITHOUT CONTRAST TECHNIQUE: Multidetector CT imaging of the chest was performed following the standard protocol without IV contrast. COMPARISON:  04-29-2019 FINDINGS: Cardiovascular: Unenhanced imaging of the heart and great vessels demonstrate no pericardial effusion. No significant atherosclerosis. Mediastinum/Nodes: No enlarged mediastinal or axillary lymph nodes. Thyroid gland, trachea, and  esophagus demonstrate no  significant findings. Lungs/Pleura: Patient is intubated, endotracheal tube 2 cm above carina. Dependent areas of consolidation are seen within the lungs, which could reflect atelectasis or aspiration. No effusion or pneumothorax. Central airways are patent. Upper Abdomen: Continued marked gaseous distension of the stomach. Decompression with enteric catheter may be useful. Musculoskeletal: There are incomplete left anterior second through fourth rib fractures, compatible with recent CPR. No other acute bony abnormalities. Reconstructed images demonstrate no additional findings. IMPRESSION: 1. Bilateral dependent lower lobe consolidation consistent with atelectasis or aspiration. 2. Incomplete left anterior second through fourth rib fractures likely related to resuscitation. 3. Intubated. Electronically Signed   By: Sharlet Salina M.D.   On: April 25, 2019 10:58   CT Cervical Spine Wo Contrast  Result Date: 04/25/2019 CLINICAL DATA:  Attempted hanging. EXAM: CT HEAD WITHOUT CONTRAST CT CERVICAL SPINE WITHOUT CONTRAST TECHNIQUE: Multidetector CT imaging of the head and cervical spine was performed following the standard protocol without intravenous contrast. Multiplanar CT image reconstructions of the cervical spine were also generated. COMPARISON:  None. FINDINGS: CT HEAD FINDINGS Brain: Small ventricles. Subarachnoid space diffusely effaced. Question global cerebral edema. No hemorrhage, fluid collection or midline shift. Vascular: Negative for hyperdense vessel Skull: Negative Sinuses/Orbits: Negative Other: None CT CERVICAL SPINE FINDINGS Alignment: Normal Skull base and vertebrae: Negative for fracture Soft tissues and spinal canal: Negative for soft tissue mass or swelling. Endotracheal tube present. Disc levels: Normal disc spaces. No degenerative change or spurring. Upper chest: Lung apices clear bilaterally. Other: None IMPRESSION: 1. Findings suspicious for global cerebral edema due to anoxia. No  intracranial hemorrhage. 2. Negative cervical spine. 3. These results were called by telephone at the time of interpretation on 25-Apr-2019 at 11:00 am to provider [facility] , who verbally acknowledged these results. Electronically Signed   By: Marlan Palau M.D.   On: 2019-04-25 11:00   US OB Comp Less 14 Wks  Result Date: 2019-04-25 CLINICAL DATA:  25 year old pregnant female presenting for evaluation of pregnancy. EXAM: OBSTETRIC <14 WK [place] US DOPPLER ULTRASOUND OF OVARIES TECHNIQUE: Transabdominal ultrasound examinations was performed. Color and duplex Doppler ultrasound was utilized to evaluate blood flow to the ovaries. COMPARISON:  None. FINDINGS: Intrauterine gestational sac: Single intrauterine gestational sac. Yolk sac:  Seen Embryo:  Present Cardiac Activity: Detected Heart Rate: 168 bpm CRL:   45 mm   11 w 2 d                  [place] EDC: 10/22/2019 Subchorionic hemorrhage:  None visualized. Maternal uterus/adnexae: The uterus measures 10.5 x 7.589.4 cm. The maternal ovaries are unremarkable. The right ovary measures 3.3 x 2.7 x 3.2 cm for a volume of 15 cc and the left ovary measures 3.9 x 2.3 x 2.8 cm for a volume of 13 cc. Doppler images demonstrate flow to both ovaries. IMPRESSION: 1. Single live intrauterine pregnancy with an estimated gestational age of [redacted] weeks, 3 days. 2. Doppler detected flow to both ovaries. Electronically Signed   By: Elgie Collard M.D.   On: April 25, 2019 22:41   US PELVIC DOPPLER LIMITED  Result Date: 2019-04-25 CLINICAL DATA:  24 year old pregnant female presenting for evaluation of pregnancy. EXAM: OBSTETRIC <14 WK [place] US DOPPLER ULTRASOUND OF OVARIES TECHNIQUE: Transabdominal ultrasound examinations was performed. Color and duplex Doppler ultrasound was utilized to evaluate blood flow to the ovaries. COMPARISON:  None. FINDINGS: Intrauterine gestational sac: Single intrauterine gestational sac. Yolk sac:  Seen Embryo:  Present Cardiac Activity: Detected Heart Rate: 168  bpm CRL:   45 mm   11 w 2 d                  [place] EDC: 10/22/2019 Subchorionic hemorrhage:  None visualized. Maternal uterus/adnexae: The uterus measures 10.5 x 7.589.4 cm. The maternal ovaries are unremarkable. The right ovary measures 3.3 x 2.7 x 3.2 cm for a volume of 15 cc and the left ovary measures 3.9 x 2.3 x 2.8 cm for a volume of 13 cc. Doppler images demonstrate flow to both ovaries. IMPRESSION: 1. Single live intrauterine pregnancy with an estimated gestational age of 101 weeks, 3 days. 2. Doppler detected flow to both ovaries. Electronically Signed   By: Elgie Collard M.D.   On: 04/08/2019 22:41   DG Chest 1V REPEAT Same Day  Result Date: 04/13/2019 CLINICAL DATA:  Intubated, suicide attempt by hanging EXAM: CHEST - 1 VIEW SAME DAY COMPARISON:  04/16/2019 10:22 a.m. FINDINGS: Single frontal view of the chest demonstrates repositioning of the endotracheal tube, tip overlying tracheal air column at level of thoracic inlet. Enteric catheter passes below diaphragm, tip excluded by collimation. Significant interval decompression of the stomach. Patchy areas of consolidation within the lungs unchanged. No effusion or pneumothorax. IMPRESSION: 1. Support devices as above. 2. Persistent bilateral lung consolidation, consistent with atelectasis or aspiration based on CT. Electronically Signed   By: Sharlet Salina M.D.   On: 04/12/2019 11:52   DG Chest Port 1 View  Result Date: 04/13/2019 CLINICAL DATA:  Acute hypoxic respiratory failure post hanging EXAM: PORTABLE CHEST 1 VIEW COMPARISON:  Radiograph 04/15/2019, CT 04/18/2019 FINDINGS: Endotracheal tube terminates in the mid trachea 3.6 cm from the carina. A transesophageal tube side port remains in the lower thoracic esophagus and should be advanced at least 5 cm to position within the gastric lumen for optimal functioning. Telemetry leads and additional support devices overlie the chest. Lateral persistent patchy bilateral opacities are again noted. No  visible pneumothorax or effusion. Cardiomediastinal contours are unremarkable. No acute osseous or soft tissue abnormality. IMPRESSION: Bilateral patchy opacities are compatible with atelectasis or aspiration as seen on CT. Recommend advancement of the transesophageal tube at least 5 cm to position the side port within the gastric lumen for optimal function. Electronically Signed   By: Kreg Shropshire M.D.   On: 04/03/2019 16:30   DG Chest Portable 1 View  Result Date: 04/17/2019 CLINICAL DATA:  24 year old female found hanging in jail cell, CPR. EXAM: PORTABLE CHEST 1 VIEW COMPARISON:  Pending chest CT today. FINDINGS: Portable AP supine view at 1022 hours. Moderate to severe gaseous distension of the stomach. Endotracheal tube tip near the carina and right mainstem, although about 8 mm above the carina on the contemporary CT. Normal cardiac size and mediastinal contours. Patchy peribronchial opacity, more apparent by CT. No evidence of pneumothorax, pleural effusion or pulmonary edema. No osseous abnormality identified. IMPRESSION: 1. ETT about 8 mm above the carina currently. 2. Moderate to severe gas distended stomach, consider NG tube decompression. 3. Patchy bilateral pulmonary opacity better demonstrated on the contemporary CT. Favor aspiration in this setting. Electronically Signed   By: Odessa Fleming M.D.   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 EEG to evaluate for seizure, ams. Level of alertness: comatose AEDs during EEG study: propofol, keppra Technical aspects: This EEG study was done with scalp electrodes positioned according to the 10-20 International system of  electrode placement. Electrical activity was acquired at a sampling rate of 500Hz  and reviewed with a high frequency filter of 70Hz  and a low frequency filter of 1Hz . EEG data were recorded continuously and digitally stored. DESCRIPTION: EEG showed generalized background suppression. EEG was not reactive to noxious stimulation. Frequent head bobbing artifact was noted. Hyperventilation and photic stimulation were not performed due to ams. ABNORMALITY - Background suppression, generalized IMPRESSION: This study is suggestive of profound diffuse encephalopathy, non specific to etiology.  No seizures or epileptiform discharges were seen throughout the recording. Charlsie Quest    Microbiology Recent Results (from the past 240 hour(s))  Respiratory Panel by RT PCR (Flu A&B, Covid) - Nasopharyngeal Swab     Status: None   Collection Time: Apr 10, 2019 10:26 AM   Specimen: Nasopharyngeal Swab  Result Value Ref Range Status   SARS Coronavirus 2 by RT PCR NEGATIVE NEGATIVE Final    Comment: (NOTE) SARS-CoV-2 target nucleic acids are NOT DETECTED. The SARS-CoV-2 RNA is generally detectable in upper respiratoy specimens during the acute phase of infection. The lowest concentration of SARS-CoV-2 viral copies this assay can detect is 131 copies/mL. A negative result does not preclude SARS-Cov-2 infection and should not be used as the sole basis for treatment or other patient management decisions. A negative result may occur with  improper specimen collection/handling, submission of specimen other than nasopharyngeal swab, presence of viral mutation(s) within the areas targeted by this assay, and inadequate number of viral copies (<131 copies/mL). A negative result must be combined with clinical observations, patient history, and epidemiological information. The expected result is Negative. Fact Sheet for Patients:  https://www.moore.com/ Fact Sheet for Healthcare Providers:   https://www.young.biz/ This test is not yet ap proved or cleared by the [place] FDA and  has been authorized for detection and/or diagnosis of SARS-CoV-2 by FDA under an Emergency Use Authorization (EUA). This EUA will remain  in effect (meaning this test can be used) for the duration of the COVID-19 declaration under Section 564(b)(1) of the Act, 21 U.S.C. section 360bbb-3(b)(1), unless the authorization is terminated or revoked sooner.    Influenza A by PCR NEGATIVE NEGATIVE Final   Influenza B by PCR NEGATIVE NEGATIVE Final    Comment: (NOTE) The Xpert Xpress SARS-CoV-2/FLU/RSV assay is intended as an aid in  the diagnosis of influenza from Nasopharyngeal swab specimens and  should not be used as a sole basis for treatment. Nasal washings and  aspirates are unacceptable for Xpert Xpress SARS-CoV-2/FLU/RSV  testing. Fact Sheet for Patients: https://www.moore.com/ Fact Sheet for Healthcare Providers: https://www.young.biz/ This test is not yet approved or cleared by the [place] FDA and  has been authorized for detection and/or diagnosis of SARS-CoV-2 by  FDA under an Emergency Use Authorization (EUA). This EUA will remain  in effect (meaning this test can be used) for the duration of the  Covid-19 declaration under Section 564(b)(1) of the Act, 21  U.S.C. section 360bbb-3(b)(1), unless the authorization is  terminated or revoked. Performed at [facility], [address] [place], [place] [address]   MRSA PCR Screening     Status: Abnormal   Collection Time: 2019-04-10  3:11 PM   Specimen: Nasal Mucosa; Nasopharyngeal  Result Value Ref Range Status   MRSA by PCR POSITIVE (A)  NEGATIVE Final    Comment:        The GeneXpert MRSA Assay (FDA approved for NASAL specimens only), is one component of a comprehensive MRSA colonization surveillance program. It is not intended to diagnose MRSA infection nor to guide  or monitor treatment for MRSA infections. RESULT CALLED TO, READ BACK BY AND VERIFIED WITH: Rosine Door RN 03/31/2019 1750 JDW Performed at [facility], [address], [place], [place] [address]     Lab Basic Metabolic Panel: Recent Labs  Lab 04/20/2019 1019 04/12/2019 1553 04/12/2019 1622  NA 137 141 138  K 3.1* 3.1* 3.5  CL 97*  --  104  CO2 12*  --  9*  GLUCOSE 232*  --  289*  BUN 16  --  25*  CREATININE 1.57*  --  1.60*  CALCIUM 9.0  --  7.6*   Liver Function Tests: Recent Labs  Lab 04/01/2019 1019 04/22/2019 1622  AST 315* 562*  ALT 351* 622*  ALKPHOS 107 163*  BILITOT 0.9 1.5*  PROT 7.3 5.5*  ALBUMIN 4.2 3.4*   No results for input(s): LIPASE, AMYLASE in the last 168 hours. No results for input(s): AMMONIA in the last 168 hours. CBC: Recent Labs  Lab 04/15/2019 1019 04/08/2019 1553  WBC 17.5*  --   NEUTROABS 10.6*  --   HGB 13.9 15.0  HCT 44.6 44.0  MCV 97.2  --   PLT 312  --    Cardiac Enzymes: Recent Labs  Lab 04/20/2019 1622  CKTOTAL 1,474*   Sepsis Labs: Recent Labs  Lab 04/10/2019 1018 04/16/2019 1019 04/15/2019 1217 04/27/2019 1622 04/12/2019 1828  WBC  --  17.5*  --   --   --   LATICACIDVEN >11.0*  --  10.6* >11.0* >11.0*   Durel Salts, MD Pulmonary and Critical Care Medicine [facility] Pager: [phone] Office:[phone]

## 2019-04-28 NOTE — Progress Notes (Signed)
Discussed with MRI tech, pt will need abd xray prior to scan. Since she is pregnant with a live fetus, informed consent will need to be obtained by [facility] prior to obtaining xray. MRI deferred.

## 2019-04-28 DEATH — deceased

## 2020-07-05 IMAGING — CT CT CHEST W/O CM
2 of 4 series · 15 of 36 positions shown, 18 images · non-contrast
Comparison: 04/04/2019

CLINICAL DATA: Attempted suicide by hanging, status post
resuscitation, intubated

EXAM:
CT CHEST WITHOUT CONTRAST
TECHNIQUE: Multidetector CT imaging of the chest was performed following the
standard protocol without IV contrast.

[Series 2: routine chest without · axial · non-contrast · 0.68mm/px · z∈[-444,-202]mm · 12 of 143 slices shown, 15 images]
[im 11/143  mediastinal]
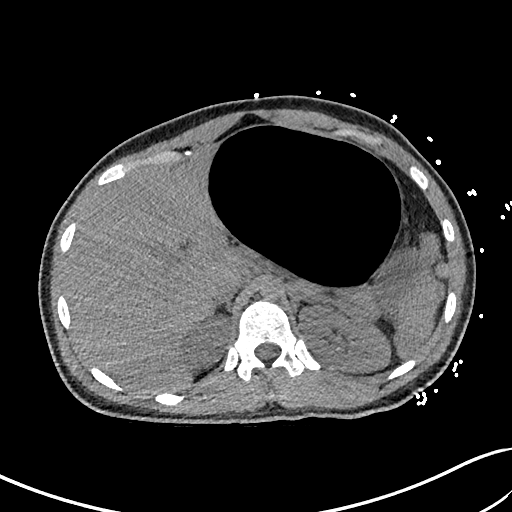
[im 11/143  lung]
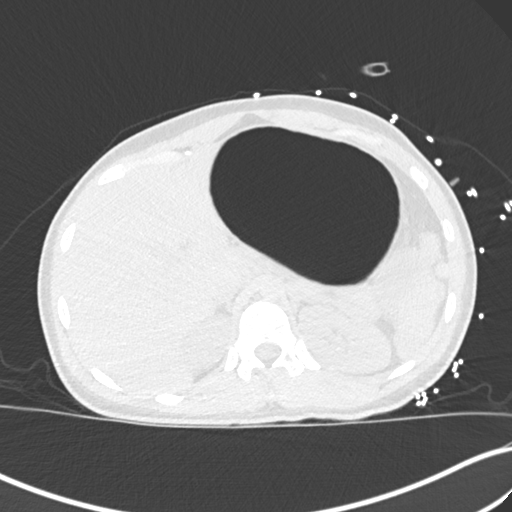
[im 22/143  lung]
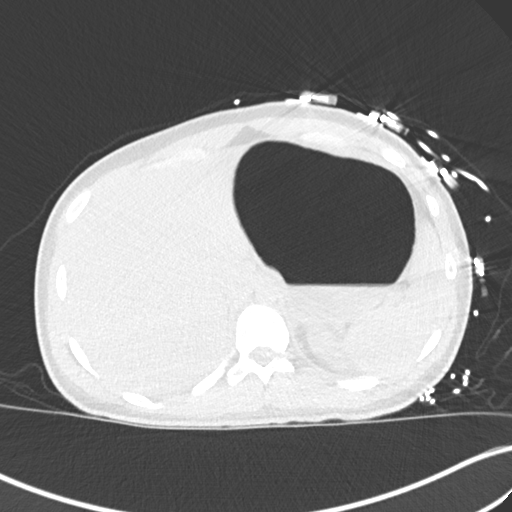
[im 33/143  lung]
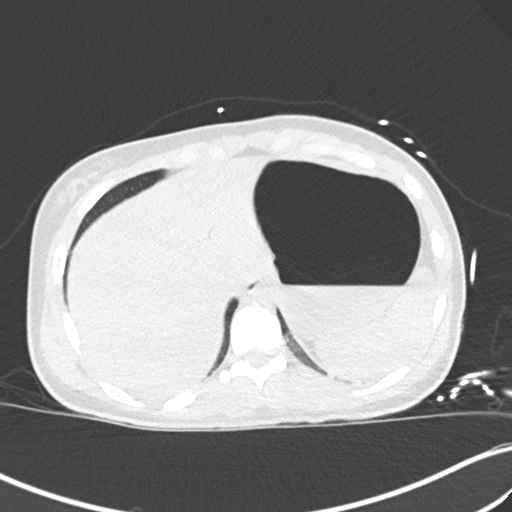
[im 44/143  lung]
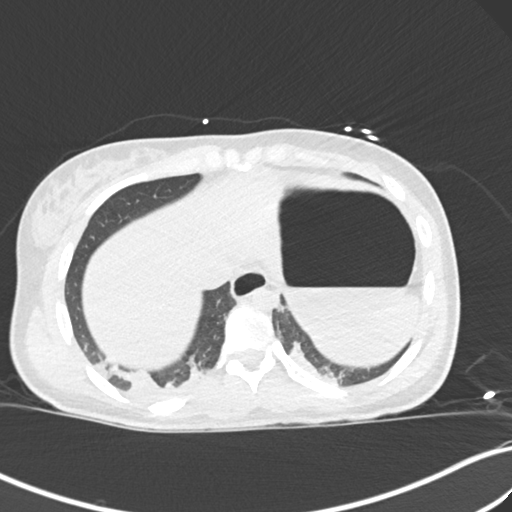
[im 55/143  mediastinal]
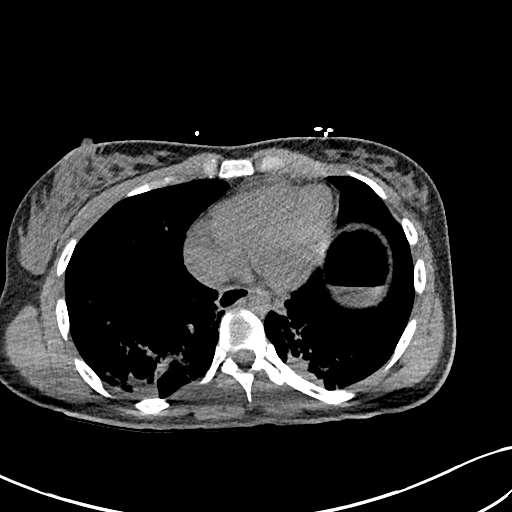
[im 55/143  lung]
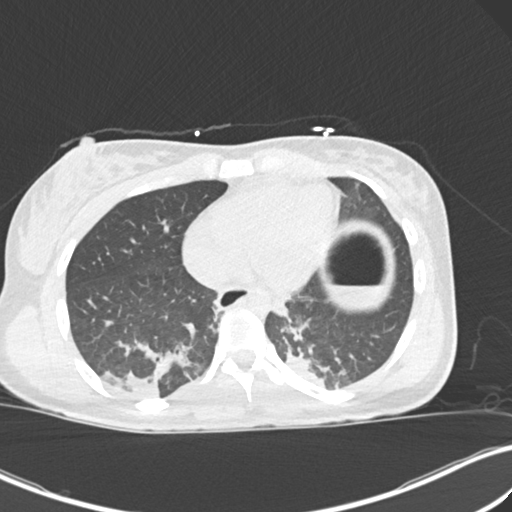
[im 66/143  lung]
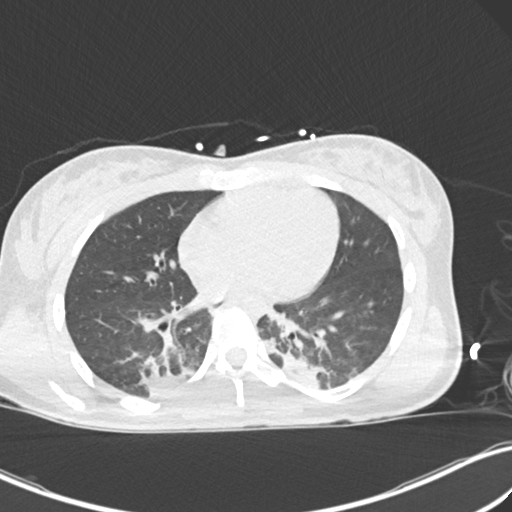
[im 77/143  lung]
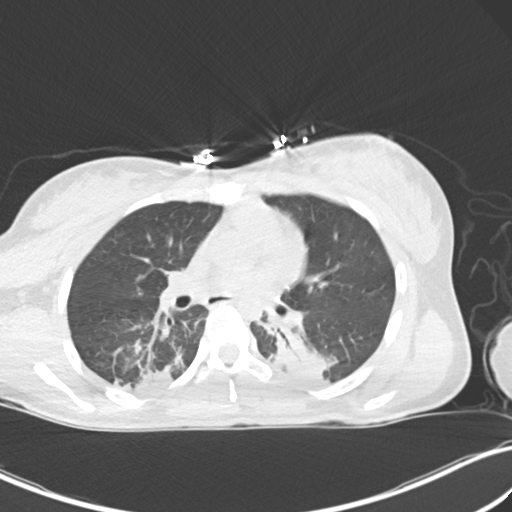
[im 88/143  lung]
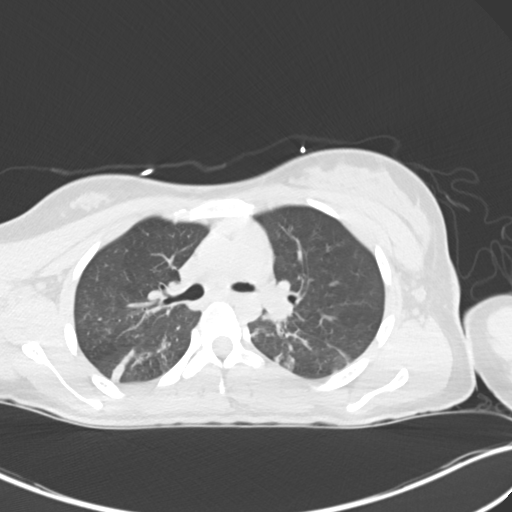
[im 99/143  mediastinal]
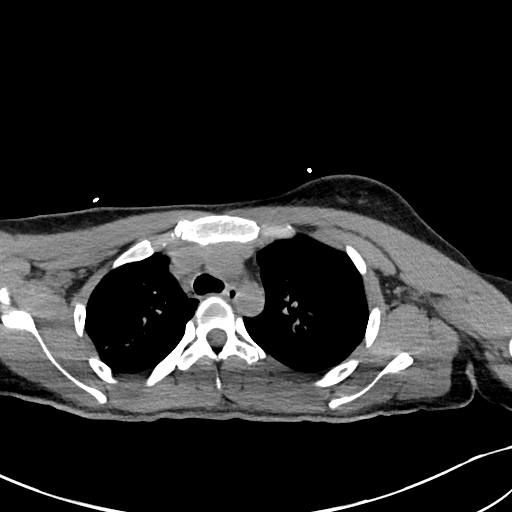
[im 99/143  lung]
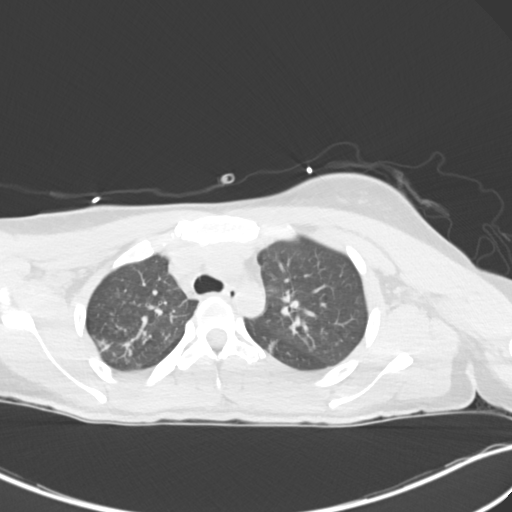
[im 110/143  lung]
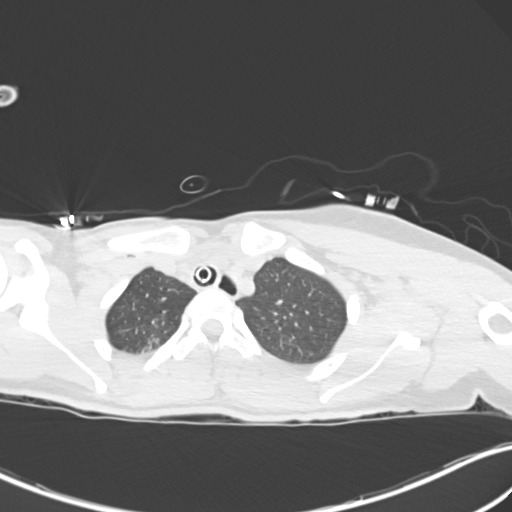
[im 121/143  lung]
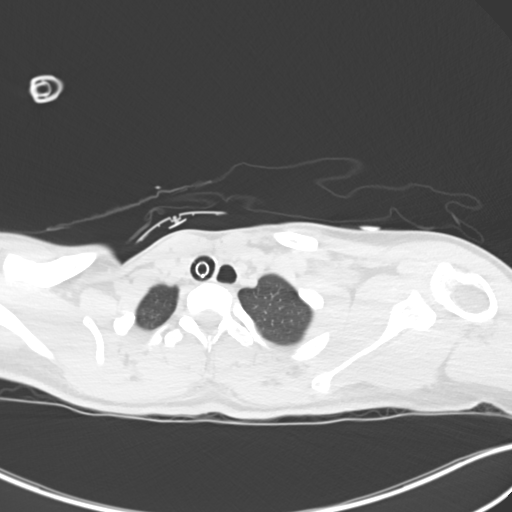
[im 132/143  lung]
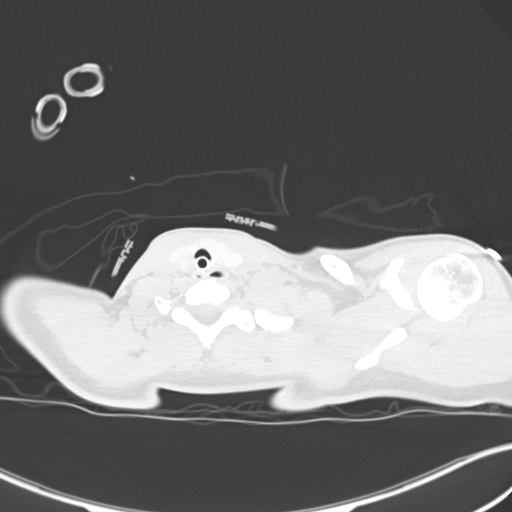

[Series 5: coronal · coronal · 0.59mm/px · 3 of 113 slices shown]
[im 23/113  lung]
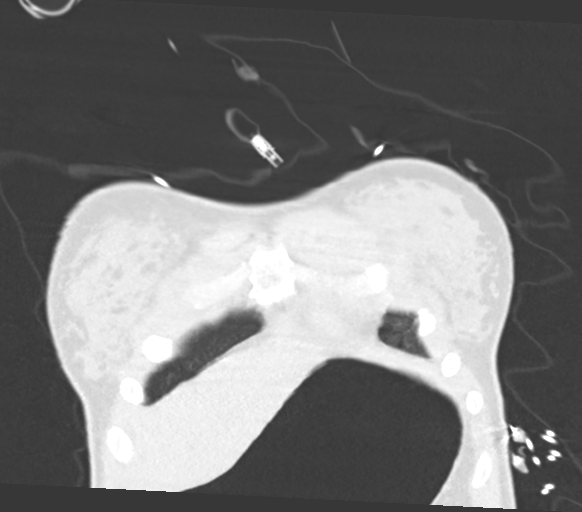
[im 45/113  lung]
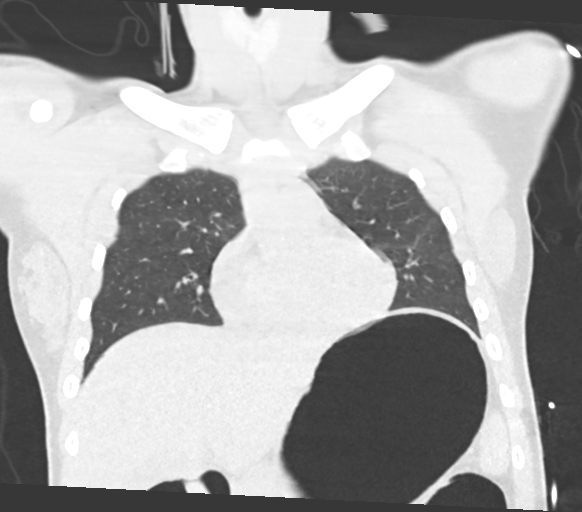
[im 68/113  lung]
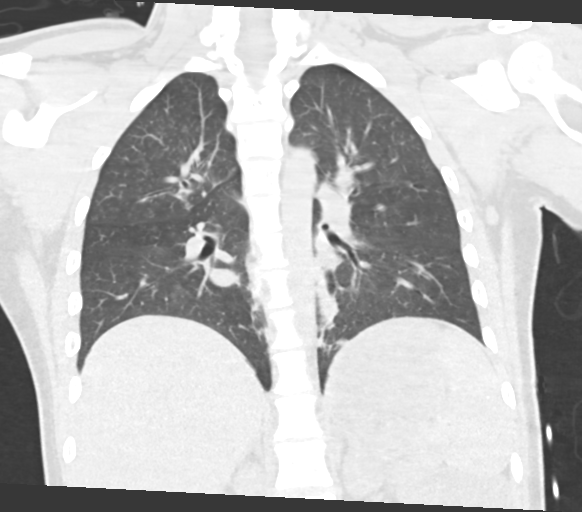

[15 of 36 positions shown; findings below may reference images not displayed]

FINDINGS: Cardiovascular: Unenhanced imaging of the heart and great vessels
demonstrate no pericardial effusion. No significant atherosclerosis.

Mediastinum/Nodes: No enlarged mediastinal or axillary lymph nodes.
Thyroid gland, trachea, and esophagus demonstrate no significant
findings.

Lungs/Pleura: Patient is intubated, endotracheal tube 2 cm above
carina. Dependent areas of consolidation are seen within the lungs,
which could reflect atelectasis or aspiration. No effusion or
pneumothorax. Central airways are patent.

Upper Abdomen: Continued marked gaseous distension of the stomach.
Decompression with enteric catheter may be useful.

Musculoskeletal: There are incomplete left anterior second through
fourth rib fractures, compatible with recent CPR. No other acute
bony abnormalities. Reconstructed images demonstrate no additional
findings.
IMPRESSION: 1. Bilateral dependent lower lobe consolidation consistent with
atelectasis or aspiration.
2. Incomplete left anterior second through fourth rib fractures
likely related to resuscitation.
3. Intubated.
